# Patient Record
Sex: Female | Born: 2000 | Race: Black or African American | Hispanic: No | Marital: Single | Smoking: Never smoker
Health system: Southern US, Community
[De-identification: ages and names within clinical notes are randomized; demographics above are authoritative.]

---
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
## 2023-03-02 IMAGING — DX DG SHOULDER 2+V*R*
2 series · 2 of 2 positions shown · non-contrast
Comparison: Radiograph 05/28/2020

CLINICAL DATA: Post reduction of the right shoulder dislocation

EXAM:
RIGHT SHOULDER - 2+ VIEW

[shoulder ap (1 of 2)]
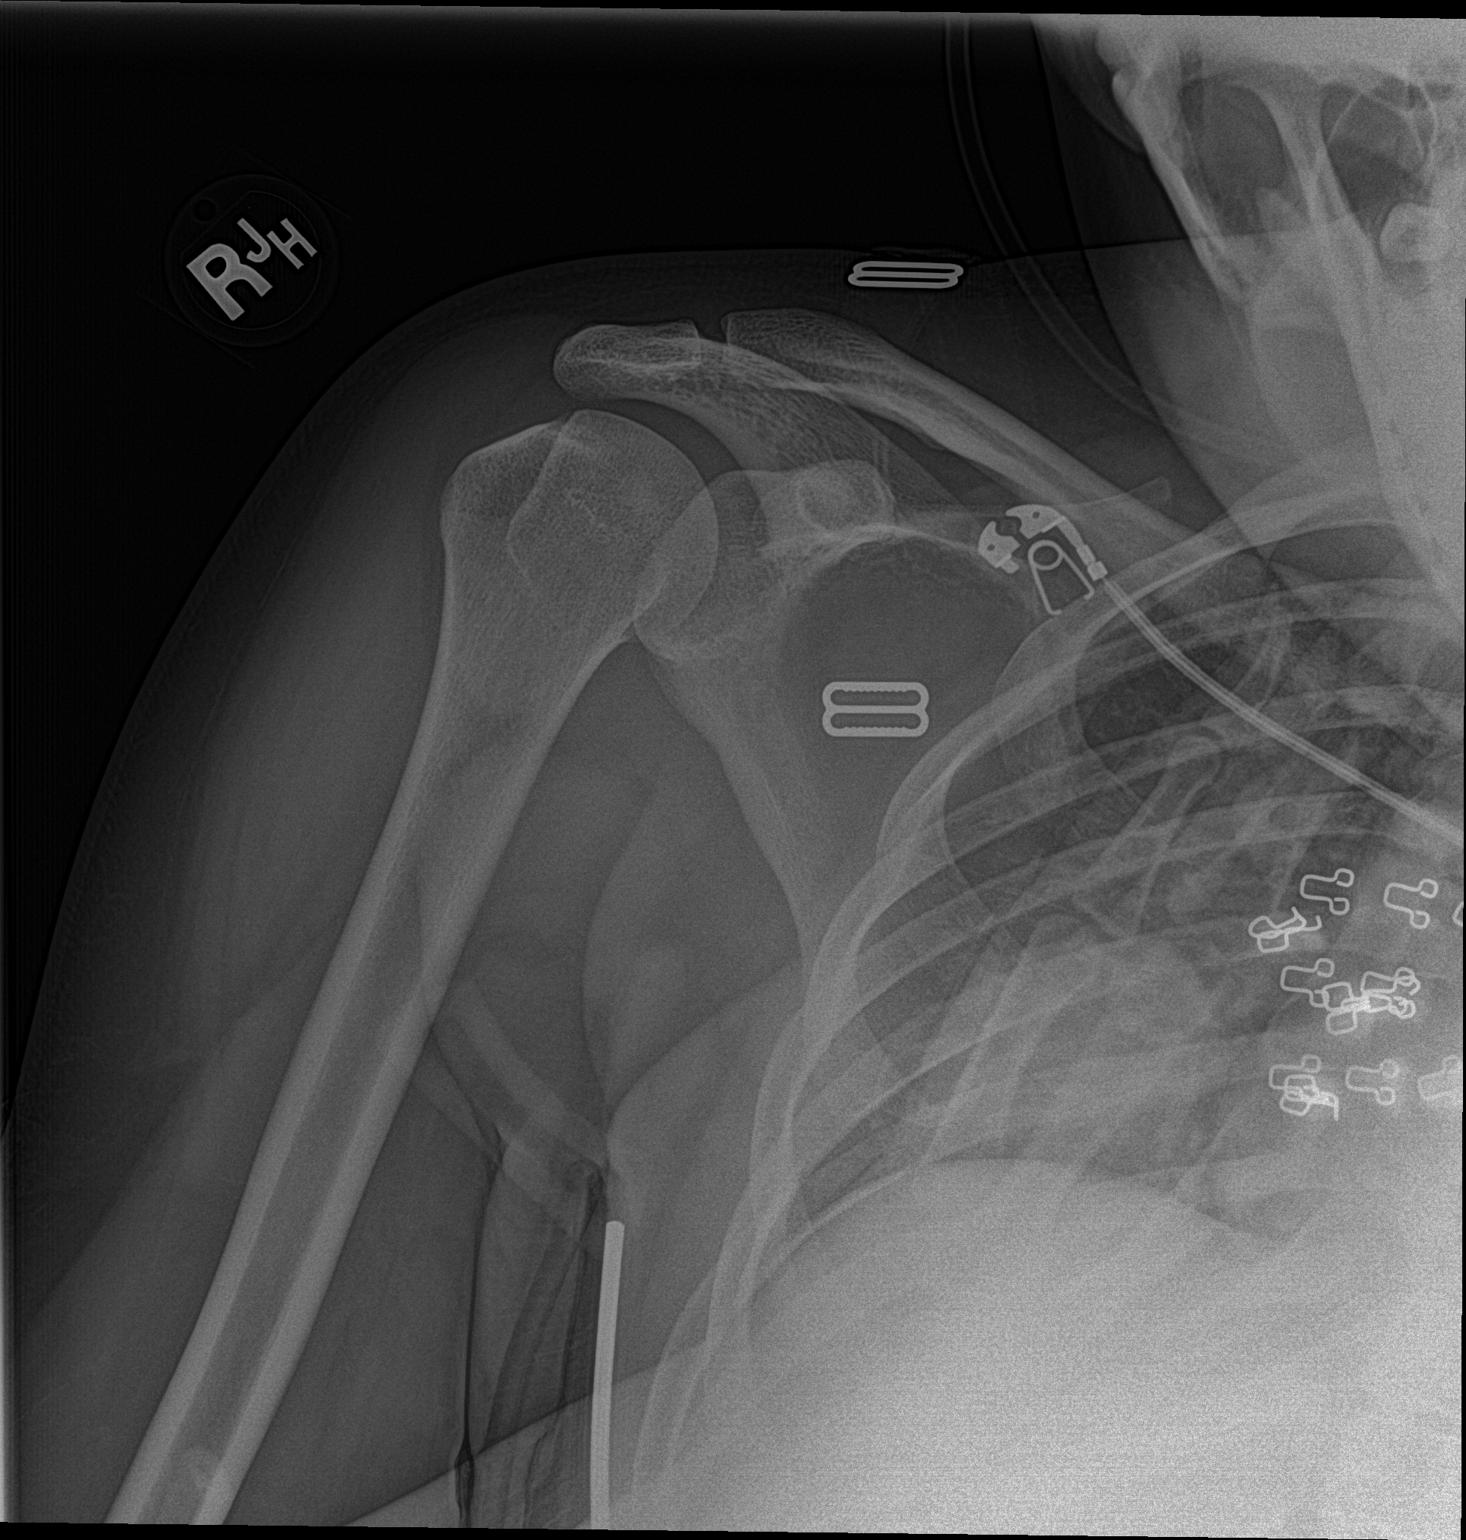

[shoulder ap (2 of 2)]
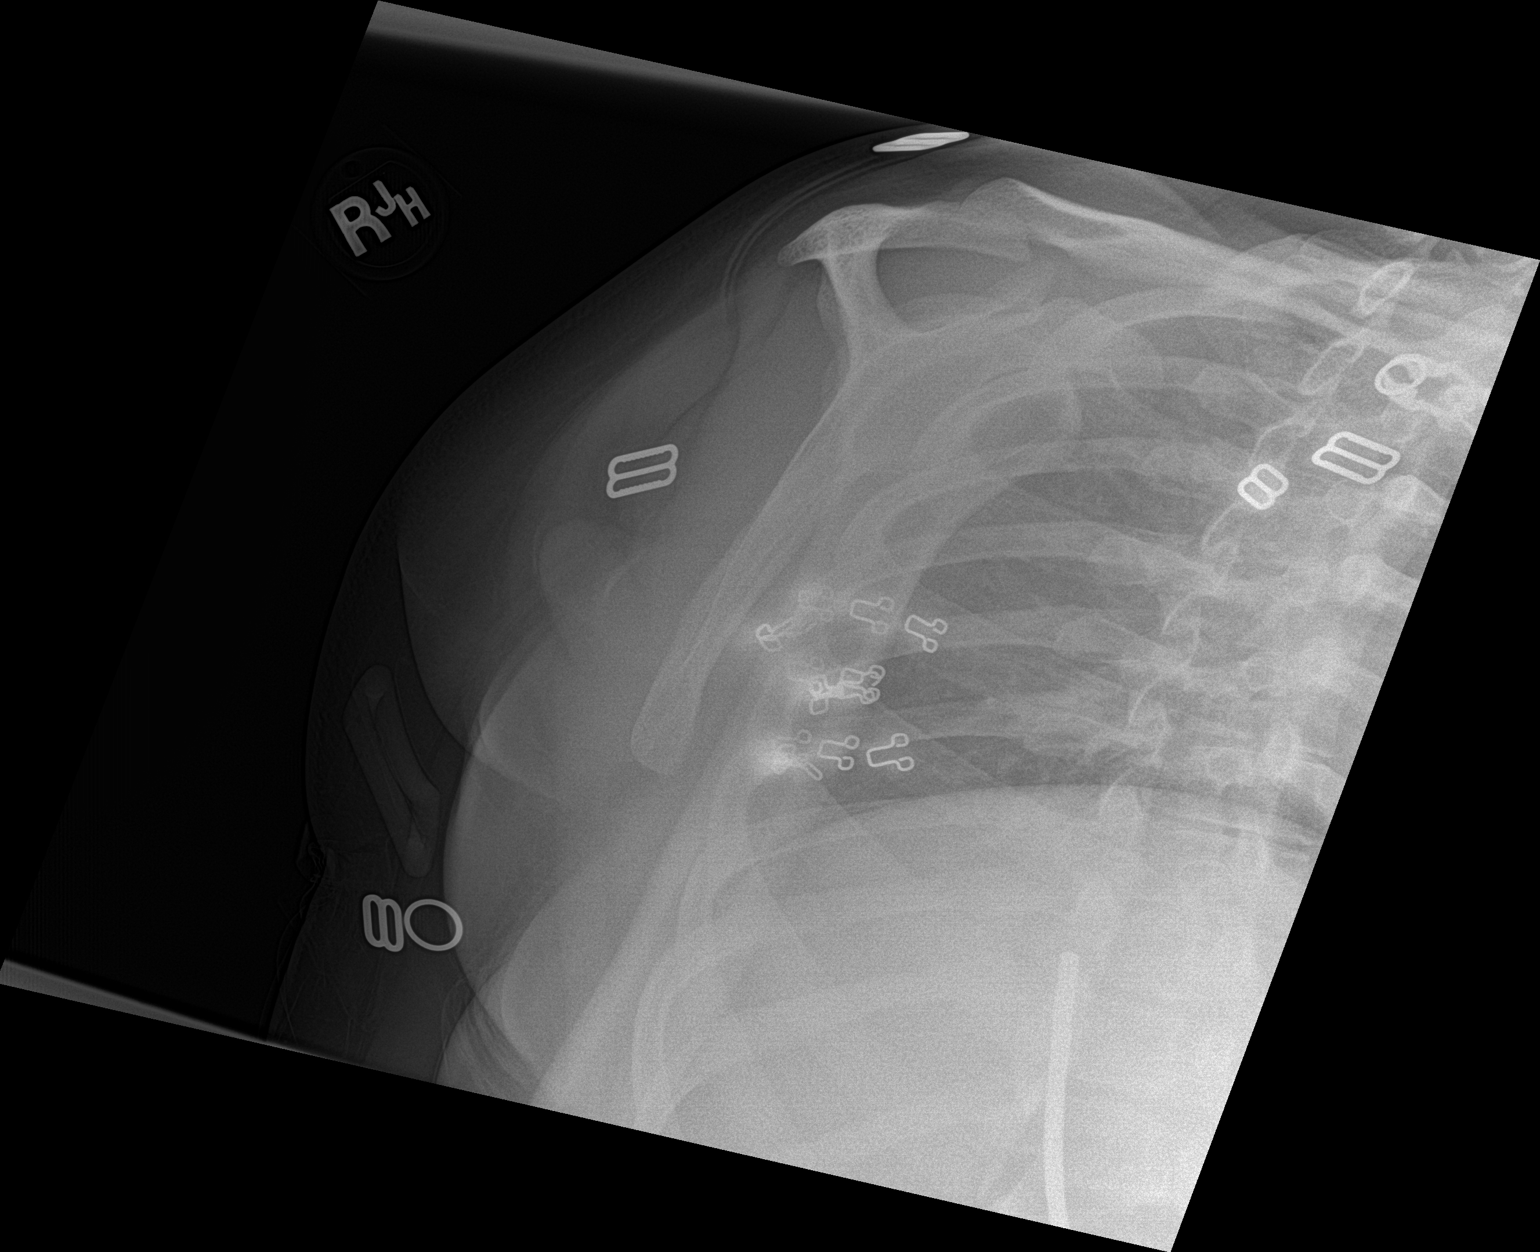

[2 of 2 positions shown; findings below may reference images not displayed]

FINDINGS: Successful relocation of the right humeral head into the glenoid
with a large Hill-Sachs deformity some slight irregularity is noted
along the anterior inferior glenoid as well which could reflect a
mild bony Bankart injury. No other acute or suspicious osseous
abnormalities. No other traumatic malalignment. Soft tissues are
unremarkable.
IMPRESSION: Successful relocation of the right humeral head into the glenoid.

Hill-Sachs deformity of the posterolateral humeral head.

Mild cortical irregularity along the anteroinferior glenoid, may
reflect a mild bony Bankart injury.

## 2024-03-19 ENCOUNTER — Emergency Department (HOSPITAL_COMMUNITY)
Admission: EM | Admit: 2024-03-19 | Discharge: 2024-03-19 | Disposition: A | Discharge status: Home / Self Care | Source: Home / Self Care | Attending: Emergency Medicine | Admitting: Emergency Medicine

## 2024-03-19 DIAGNOSIS — Z202 Contact with and (suspected) exposure to infections with a predominantly sexual mode of transmission: Secondary | ICD-10-CM

## 2024-03-19 DIAGNOSIS — A5901 Trichomonal vulvovaginitis: Secondary | ICD-10-CM

## 2024-03-19 LAB — URINALYSIS, ROUTINE W REFLEX MICROSCOPIC
Bacteria, UA: NONE SEEN
Bilirubin Urine: NEGATIVE
Glucose, UA: NEGATIVE mg/dL
Ketones, ur: NEGATIVE mg/dL
Nitrite: NEGATIVE
Protein, ur: 30 mg/dL — AB
RBC / HPF: >50 RBC/hpf (ref 0–5)
Specific Gravity, Urine: 1.019 (ref 1.005–1.030)
pH: 7 (ref 5.0–8.0)

## 2024-03-19 LAB — WET PREP, GENITAL
Clue Cells Wet Prep HPF POC: NONE SEEN
Sperm: NONE SEEN
WBC, Wet Prep HPF POC: <10
Yeast Wet Prep HPF POC: NONE SEEN

## 2024-03-19 LAB — HIV ANTIBODY (ROUTINE TESTING W REFLEX): HIV Screen 4th Generation wRfx: NONREACTIVE

## 2024-03-19 LAB — PREGNANCY, URINE: Preg Test, Ur: NEGATIVE

## 2024-03-19 MED ORDER — DOXYCYCLINE HYCLATE 100 MG PO TABS: 100.0000 mg | ORAL_TABLET | Freq: Two times a day (BID) | ORAL | 0 refills | Status: AC

## 2024-03-19 MED ORDER — CEFTRIAXONE SODIUM 500 MG IJ SOLR
500.0000 mg | Freq: Once | INTRAMUSCULAR | Status: AC
  Administered 2024-03-19: 500 mg via INTRAMUSCULAR
  Filled 2024-03-19: qty 500

## 2024-03-19 MED ORDER — METRONIDAZOLE 500 MG PO TABS: 500.0000 mg | ORAL_TABLET | Freq: Two times a day (BID) | ORAL | 0 refills | Status: AC

## 2024-03-19 MED ORDER — LIDOCAINE HCL (PF) 1 % IJ SOLN
1.0000 mL | Freq: Once | INTRAMUSCULAR | Status: AC
  Administered 2024-03-19: 1 mL
  Filled 2024-03-19: qty 5

## 2024-03-19 NOTE — Discharge Instructions (Signed)
 You have been treated for gonorrhea chlamydia and trichomoniasis.  Do not drink while on the Flagyl .  Your sexual contact will need treatment for trichomoniasis also.

## 2024-03-19 NOTE — ED Triage Notes (Signed)
 Pt comes in for STD check.  Pt's partner tested positive for gonorrhea a weeks ago.    Pt is having itchiness.   Pt is concerned her menstrual cycle is heavier and longer than normal. Pt started her menstrual cycle 1/31. Pt is still on her menstrual cycle. A&Ox4.

## 2024-03-19 NOTE — ED Provider Notes (Signed)
 " [place] EMERGENCY DEPARTMENT AT [facility] Provider Note   CSN: [id number] Arrival date & time: 03/19/24  1412     Patient presents with: SEXUALLY TRANSMITTED DISEASE   Cheryl Salazar is a 24 y.o. female.   HPI Patient presents for STD exposure.  States her sexual partner was diagnosed with gonorrhea.  Patient has had some vaginal itching.  Menstrual cycle is current but is been lasting a little longer than normal.  No specific discharge.    Prior to Admission medications  Medication Sig Start Date End Date Taking? Authorizing Provider  busPIRone (BUSPAR) 10 MG tablet Take 10 mg by mouth 3 (three) times daily.   Yes [provider]  doxycycline  (VIBRA -TABS) 100 MG tablet Take 1 tablet (100 mg total) by mouth 2 (two) times daily for 7 days. 03/19/24 03/26/24 Yes Patsey Lot, MD  metroNIDAZOLE  (FLAGYL ) 500 MG tablet Take 1 tablet (500 mg total) by mouth 2 (two) times daily. 03/19/24  Yes Patsey Lot, MD    Allergies: Prednisone    Review of Systems  Updated Vital Signs BP 132/84 (BP Location: Right Arm)   Pulse 97   Temp 98.4 F (36.9 C) (Oral)   Resp 19   LMP 03/14/2024 (Approximate)   SpO2 100%   Physical Exam Vitals and nursing note reviewed.  Abdominal:     Tenderness: There is no abdominal tenderness.  Genitourinary:    Comments: Deferred by patient and self swab done. Neurological:     Mental Status: She is alert.     (all labs ordered are listed, but only abnormal results are displayed) Labs Reviewed  WET PREP, GENITAL - Abnormal; Notable for the following components:      Result Value   Trich, Wet Prep PRESENT (*)    All other components within normal limits  URINALYSIS, ROUTINE W REFLEX MICROSCOPIC - Abnormal; Notable for the following components:   APPearance HAZY (*)    Hgb urine dipstick LARGE (*)    Protein, ur 30 (*)    Leukocytes,Ua TRACE (*)    All other components within normal limits  PREGNANCY, URINE  HIV  ANTIBODY (ROUTINE TESTING W REFLEX)  SYPHILIS: RPR W/REFLEX TO RPR TITER AND TREPONEMAL ANTIBODIES, TRADITIONAL SCREENING AND DIAGNOSIS ALGORITHM  GC/CHLAMYDIA PROBE AMP ([place]) NOT AT [facility]    EKG: None  Radiology: No results found.   Procedures   Medications Ordered in the ED  cefTRIAXone  (ROCEPHIN ) injection 500 mg (500 mg Intramuscular Given 03/19/24 1549)  lidocaine  (PF) (XYLOCAINE ) 1 % injection 1-2.1 mL (1 mL Other Given 03/19/24 1550)                                    Medical Decision Making Amount and/or Complexity of Data Reviewed Labs: ordered.  Risk Prescription drug management.   Patient vaginal itching.  Did have gonorrhea exposure.  Will treat with Rocephin  and azithromycin for STD exposure.  Also wet prep done and did show trichomoniasis.  Will treat her and patient was instructed that the partner would need treatment.  Patient is not pregnant.  RPR and HIV testing still pending.  As is gonorrhea and chlamydia.  Discharge home.     Final diagnoses:  Exposure to gonorrhea  Trichomonal vaginitis    ED Discharge Orders          Ordered    doxycycline  (VIBRA -TABS) 100 MG tablet  2 times daily  03/19/24 1535    metroNIDAZOLE  (FLAGYL ) 500 MG tablet  2 times daily        03/19/24 1536               Patsey Lot, MD 03/19/24 1634  "

## 2024-03-20 LAB — GC/CHLAMYDIA PROBE AMP (~~LOC~~) NOT AT ARMC
Chlamydia: NEGATIVE
Comment: NEGATIVE
Comment: NORMAL
Neisseria Gonorrhea: NEGATIVE

## 2024-03-20 LAB — SYPHILIS: RPR W/REFLEX TO RPR TITER AND TREPONEMAL ANTIBODIES, TRADITIONAL SCREENING AND DIAGNOSIS ALGORITHM: RPR Ser Ql: NONREACTIVE
